# Patient Record
Sex: Female | Born: 1980 | Race: White | Hispanic: No | Marital: Married | Smoking: Never smoker
Health system: Southern US, Community
[De-identification: ages and names within clinical notes are randomized; demographics above are authoritative.]

## PROBLEM LIST (undated history)

## (undated) DIAGNOSIS — N200 Calculus of kidney: Secondary | ICD-10-CM

## (undated) DIAGNOSIS — R7303 Prediabetes: Secondary | ICD-10-CM

## (undated) HISTORY — PX: TONSILLECTOMY: SUR1361

---
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
## 2003-10-13 DIAGNOSIS — N200 Calculus of kidney: Secondary | ICD-10-CM

## 2003-10-13 HISTORY — DX: Calculus of kidney: N20.0

## 2014-02-22 ENCOUNTER — Emergency Department (HOSPITAL_COMMUNITY): Payer: BC Managed Care – PPO

## 2014-02-22 ENCOUNTER — Encounter (HOSPITAL_COMMUNITY): Payer: Self-pay | Admitting: Emergency Medicine

## 2014-02-22 ENCOUNTER — Emergency Department (HOSPITAL_COMMUNITY)
Admission: EM | Admit: 2014-02-22 | Discharge: 2014-02-22 | Disposition: A | Discharge status: Home / Self Care | Payer: BC Managed Care – PPO | Attending: Emergency Medicine | Admitting: Emergency Medicine

## 2014-02-22 DIAGNOSIS — M545 Low back pain, unspecified: Secondary | ICD-10-CM

## 2014-02-22 DIAGNOSIS — S161XXA Strain of muscle, fascia and tendon at neck level, initial encounter: Secondary | ICD-10-CM

## 2014-02-22 DIAGNOSIS — Z8639 Personal history of other endocrine, nutritional and metabolic disease: Secondary | ICD-10-CM | POA: Insufficient documentation

## 2014-02-22 DIAGNOSIS — R05 Cough: Secondary | ICD-10-CM | POA: Insufficient documentation

## 2014-02-22 DIAGNOSIS — R000 Tachycardia, unspecified: Secondary | ICD-10-CM | POA: Insufficient documentation

## 2014-02-22 DIAGNOSIS — R160 Hepatomegaly, not elsewhere classified: Secondary | ICD-10-CM | POA: Diagnosis present

## 2014-02-22 DIAGNOSIS — K7689 Other specified diseases of liver: Secondary | ICD-10-CM | POA: Insufficient documentation

## 2014-02-22 DIAGNOSIS — H833X9 Noise effects on inner ear, unspecified ear: Secondary | ICD-10-CM | POA: Insufficient documentation

## 2014-02-22 DIAGNOSIS — K760 Fatty (change of) liver, not elsewhere classified: Secondary | ICD-10-CM

## 2014-02-22 DIAGNOSIS — K7581 Nonalcoholic steatohepatitis (NASH): Secondary | ICD-10-CM | POA: Diagnosis present

## 2014-02-22 DIAGNOSIS — R109 Unspecified abdominal pain: Secondary | ICD-10-CM

## 2014-02-22 DIAGNOSIS — E66813 Obesity, class 3: Secondary | ICD-10-CM | POA: Diagnosis present

## 2014-02-22 DIAGNOSIS — R059 Cough, unspecified: Secondary | ICD-10-CM | POA: Insufficient documentation

## 2014-02-22 DIAGNOSIS — M542 Cervicalgia: Secondary | ICD-10-CM | POA: Insufficient documentation

## 2014-02-22 DIAGNOSIS — R0602 Shortness of breath: Secondary | ICD-10-CM | POA: Insufficient documentation

## 2014-02-22 DIAGNOSIS — R1084 Generalized abdominal pain: Secondary | ICD-10-CM | POA: Diagnosis present

## 2014-02-22 DIAGNOSIS — Z862 Personal history of diseases of the blood and blood-forming organs and certain disorders involving the immune mechanism: Secondary | ICD-10-CM | POA: Insufficient documentation

## 2014-02-22 DIAGNOSIS — R509 Fever, unspecified: Secondary | ICD-10-CM | POA: Insufficient documentation

## 2014-02-22 HISTORY — DX: Prediabetes: R73.03

## 2014-02-22 HISTORY — DX: Calculus of kidney: N20.0

## 2014-02-22 LAB — URINALYSIS, ROUTINE W REFLEX MICROSCOPIC
GLUCOSE, UA: NEGATIVE mg/dL
Ketones, ur: NEGATIVE mg/dL
LEUKOCYTES UA: NEGATIVE
Nitrite: NEGATIVE
PH: 5.5 (ref 5.0–8.0)
Protein, ur: NEGATIVE mg/dL
Specific Gravity, Urine: 1.023 (ref 1.005–1.030)
Urobilinogen, UA: 2 mg/dL — ABNORMAL HIGH (ref 0.0–1.0)

## 2014-02-22 LAB — COMPREHENSIVE METABOLIC PANEL
ALT: 44 U/L — ABNORMAL HIGH (ref 0–35)
AST: 44 U/L — ABNORMAL HIGH (ref 0–37)
Albumin: 3.8 g/dL (ref 3.5–5.2)
Alkaline Phosphatase: 172 U/L — ABNORMAL HIGH (ref 39–117)
BUN: 7 mg/dL (ref 6–23)
CHLORIDE: 99 meq/L (ref 96–112)
CO2: 25 meq/L (ref 19–32)
Calcium: 9.2 mg/dL (ref 8.4–10.5)
Creatinine, Ser: 0.62 mg/dL (ref 0.50–1.10)
GFR calc Af Amer: >90 mL/min (ref >90)
GFR calc non Af Amer: >90 mL/min (ref >90)
GLUCOSE: 105 mg/dL — AB (ref 70–99)
POTASSIUM: 3.9 meq/L (ref 3.7–5.3)
SODIUM: 137 meq/L (ref 137–147)
Total Bilirubin: 1.3 mg/dL — ABNORMAL HIGH (ref 0.3–1.2)
Total Protein: 7.3 g/dL (ref 6.0–8.3)

## 2014-02-22 LAB — CBC
HCT: 36.9 % (ref 36.0–46.0)
HEMOGLOBIN: 12.9 g/dL (ref 12.0–15.0)
MCH: 27.2 pg (ref 26.0–34.0)
MCHC: 35 g/dL (ref 30.0–36.0)
MCV: 77.8 fL — ABNORMAL LOW (ref 78.0–100.0)
Platelets: 180 10*3/uL (ref 150–400)
RBC: 4.74 MIL/uL (ref 3.87–5.11)
RDW: 14.1 % (ref 11.5–15.5)
WBC: 4 10*3/uL (ref 4.0–10.5)

## 2014-02-22 LAB — URINE MICROSCOPIC-ADD ON

## 2014-02-22 LAB — POC URINE PREG, ED: Preg Test, Ur: NEGATIVE

## 2014-02-22 MED ORDER — MORPHINE SULFATE 4 MG/ML IJ SOLN
4.0000 mg | Freq: Once | INTRAMUSCULAR | Status: AC
  Administered 2014-02-22: 4 mg via INTRAVENOUS
  Filled 2014-02-22: qty 1

## 2014-02-22 MED ORDER — METHOCARBAMOL 500 MG PO TABS: 500.0000 mg | ORAL_TABLET | Freq: Three times a day (TID) | ORAL | Status: DC | PRN

## 2014-02-22 MED ORDER — HYDROCODONE-ACETAMINOPHEN 5-325 MG PO TABS: 1.0000 | ORAL_TABLET | ORAL | Status: DC | PRN

## 2014-02-22 MED ORDER — ACETAMINOPHEN 325 MG PO TABS
650.0000 mg | ORAL_TABLET | Freq: Once | ORAL | Status: AC
  Administered 2014-02-22: 650 mg via ORAL
  Filled 2014-02-22: qty 2

## 2014-02-22 MED ORDER — HYDROMORPHONE HCL PF 1 MG/ML IJ SOLN
1.0000 mg | Freq: Once | INTRAMUSCULAR | Status: AC
  Administered 2014-02-22: 1 mg via INTRAVENOUS
  Filled 2014-02-22: qty 1

## 2014-02-22 MED ORDER — IOHEXOL 300 MG/ML  SOLN
50.0000 mL | Freq: Once | INTRAMUSCULAR | Status: AC | PRN
  Administered 2014-02-22: 50 mL via ORAL

## 2014-02-22 MED ORDER — SODIUM CHLORIDE 0.9 % IV BOLUS (SEPSIS)
500.0000 mL | Freq: Once | INTRAVENOUS | Status: AC
  Administered 2014-02-22: 500 mL via INTRAVENOUS

## 2014-02-22 MED ORDER — IOHEXOL 300 MG/ML  SOLN
100.0000 mL | Freq: Once | INTRAMUSCULAR | Status: AC | PRN
  Administered 2014-02-22: 100 mL via INTRAVENOUS
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
## 2014-02-22 NOTE — Discharge Instructions (Signed)
Take Vicodin for severe pain only. No driving or operating heavy machinery while taking vicodin. This medication may cause drowsiness. Take Robaxin as needed as directed for muscle spasm. No driving or operating heavy machinery while taking robaxin. This medication may cause drowsiness.  Abdominal Pain, Adult Many things can cause abdominal pain. Usually, abdominal pain is not caused by a disease and will improve without treatment. It can often be observed and treated at home. Your health care provider will do a physical exam and possibly order blood tests and X-rays to help determine the seriousness of your pain. However, in many cases, more time must pass before a clear cause of the pain can be found. Before that point, your health care provider may not know if you need more testing or further treatment. HOME CARE INSTRUCTIONS  Monitor your abdominal pain for any changes. The following actions may help to alleviate any discomfort you are experiencing:  Only take over-the-counter or prescription medicines as directed by your health care provider.  Do not take laxatives unless directed to do so by your health care provider.  Try a clear liquid diet (broth, tea, or water) as directed by your health care provider. Slowly move to a bland diet as tolerated. SEEK MEDICAL CARE IF:  You have unexplained abdominal pain.  You have abdominal pain associated with nausea or diarrhea.  You have pain when you urinate or have a bowel movement.  You experience abdominal pain that wakes you in the night.  You have abdominal pain that is worsened or improved by eating food.  You have abdominal pain that is worsened with eating fatty foods. SEEK IMMEDIATE MEDICAL CARE IF:   Your pain does not go away within 2 hours.  You have a fever.  You keep throwing up (vomiting).  Your pain is felt only in portions of the abdomen, such as the right side or the left lower portion of the abdomen.  You pass bloody  or black tarry stools. MAKE SURE YOU:  Understand these instructions.   Will watch your condition.   Will get help right away if you are not doing well or get worse.  Document Released: 07/08/2005 Document Revised: 07/19/2013 Document Reviewed: 06/07/2013 [facility] Patient Information 2014 [place], [place].  Cervical Sprain A cervical sprain is an injury in the neck in which the strong, fibrous tissues (ligaments) that connect your neck bones stretch or tear. Cervical sprains can range from mild to severe. Severe cervical sprains can cause the neck vertebrae to be unstable. This can lead to damage of the spinal cord and can result in serious nervous system problems. The amount of time it takes for a cervical sprain to get better depends on the cause and extent of the injury. Most cervical sprains heal in 1 to 3 weeks. CAUSES  Severe cervical sprains may be caused by:   Contact sport injuries (such as from football, rugby, wrestling, hockey, auto racing, gymnastics, diving, martial arts, or boxing).   Motor vehicle collisions.   Whiplash injuries. This is an injury from a sudden forward-and backward whipping movement of the head and neck.  Falls.  Mild cervical sprains may be caused by:   Being in an awkward position, such as while cradling a telephone between your ear and shoulder.   Sitting in a chair that does not offer proper support.   Working at a poorly Marketing executive station.   Looking up or down for long periods of time.  SYMPTOMS   Pain, soreness,  stiffness, or a burning sensation in the front, back, or sides of the neck. This discomfort may develop immediately after the injury or slowly, 24 hours or more after the injury.   Pain or tenderness directly in the middle of the back of the neck.   Shoulder or upper back pain.   Limited ability to move the neck.   Headache.   Dizziness.   Weakness, numbness, or tingling in the hands or arms.    Muscle spasms.   Difficulty swallowing or chewing.   Tenderness and swelling of the neck.  DIAGNOSIS  Most of the time your health care provider can diagnose a cervical sprain by taking your history and doing a physical exam. Your health care provider will ask about previous neck injuries and any known neck problems, such as arthritis in the neck. X-rays may be taken to find out if there are any other problems, such as with the bones of the neck. Other tests, such as a CT scan or MRI, may also be needed.  TREATMENT  Treatment depends on the severity of the cervical sprain. Mild sprains can be treated with rest, keeping the neck in place (immobilization), and pain medicines. Severe cervical sprains are immediately immobilized. Further treatment is done to help with pain, muscle spasms, and other symptoms and may include:  Medicines, such as pain relievers, numbing medicines, or muscle relaxants.   Physical therapy. This may involve stretching exercises, strengthening exercises, and posture training. Exercises and improved posture can help stabilize the neck, strengthen muscles, and help stop symptoms from returning.  HOME CARE INSTRUCTIONS   Put ice on the injured area.   Put ice in a plastic bag.   Place a towel between your skin and the bag.   Leave the ice on for 15 20 minutes, 3 4 times a day.   If your injury was severe, you may have been given a cervical collar to wear. A cervical collar is a two-piece collar designed to keep your neck from moving while it heals.  Do not remove the collar unless instructed by your health care provider.  If you have long hair, keep it outside of the collar.  Ask your health care provider before making any adjustments to your collar. Minor adjustments may be required over time to improve comfort and reduce pressure on your chin or on the back of your head.  Ifyou are allowed to remove the collar for cleaning or bathing, follow your  health care provider's instructions on how to do so safely.  Keep your collar clean by wiping it with mild soap and water and drying it completely. If the collar you have been given includes removable pads, remove them every 1 2 days and hand wash them with soap and water. Allow them to air dry. They should be completely dry before you wear them in the collar.  If you are allowed to remove the collar for cleaning and bathing, wash and dry the skin of your neck. Check your skin for irritation or sores. If you see any, tell your health care provider.  Do not drive while wearing the collar.   Only take over-the-counter or prescription medicines for pain, discomfort, or fever as directed by your health care provider.   Keep all follow-up appointments as directed by your health care provider.   Keep all physical therapy appointments as directed by your health care provider.   Make any needed adjustments to your workstation to promote good posture.  Avoid positions and activities that make your symptoms worse.   Warm up and stretch before being active to help prevent problems.  SEEK MEDICAL CARE IF:   Your pain is not controlled with medicine.   You are unable to decrease your pain medicine over time as planned.   Your activity level is not improving as expected.  SEEK IMMEDIATE MEDICAL CARE IF:   You develop any bleeding.  You develop stomach upset.  You have signs of an allergic reaction to your medicine.   Your symptoms get worse.   You develop new, unexplained symptoms.   You have numbness, tingling, weakness, or paralysis in any part of your body.  MAKE SURE YOU:   Understand these instructions.  Will watch your condition.  Will get help right away if you are not doing well or get worse. Document Released: 07/26/2007 Document Revised: 07/19/2013 Document Reviewed: 04/05/2013 [facility] Patient Information 2014 [place], [place].  Fatty Liver Fatty liver is  the accumulation of fat in liver cells. It is also called hepatosteatosis or steatohepatitis. It is normal for your liver to contain some fat. If fat is more than 5 to 10% of your liver's weight, you have fatty liver.  There are often no symptoms (problems) for years while damage is still occurring. People often learn about their fatty liver when they have medical tests for other reasons. Fat can damage your liver for years or even decades without causing problems. When it becomes severe, it can cause fatigue, weight loss, weakness, and confusion. This makes you more likely to develop more serious liver problems. The liver is the largest organ in the body. It does a lot of work and often gives no warning signs when it is sick until late in a disease. The liver has many important jobs including:  Breaking down foods.  Storing vitamins, iron, and other minerals.  Making proteins.  Making bile for food digestion.  Breaking down many products including medications, alcohol and some poisons. CAUSES  There are a number of different conditions, medications, and poisons that can cause a fatty liver. Eating too many calories causes fat to build up in the liver. Not processing and breaking fats down normally may also cause this. Certain conditions, such as obesity, diabetes, and high triglycerides also cause this. Most fatty liver patients tend to be middle-aged and over weight.  Some causes of fatty liver are:  Alcohol over consumption.  Malnutrition.  Steroid use.  Valproic acid toxicity.  Obesity.  Cushing's syndrome.  Poisons.  Tetracycline in high dosages.  Pregnancy.  Diabetes.  Hyperlipidemia.  Rapid weight loss. Some people develop fatty liver even having none of these conditions. SYMPTOMS  Fatty liver most often causes no problems. This is called asymptomatic.  It can be diagnosed with blood tests and also by a liver biopsy.  It is one of the most common causes of minor  elevations of liver enzymes on routine blood tests.  Specialized Imaging of the liver using ultrasound, CT (computed tomography) scan, or MRI (magnetic resonance imaging) can suggest a fatty liver but a biopsy is needed to confirm it.  A biopsy involves taking a small sample of liver tissue. This is done by using a needle. It is then looked at under a microscope by a specialist. TREATMENT  It is important to treat the cause. Simple fatty liver without a medical reason may not need treatment.  Weight loss, fat restriction, and exercise in overweight patients produces inconsistent results but is worth  trying.  Fatty liver due to alcohol toxicity may not improve even with stopping drinking.  Good control of diabetes may reduce fatty liver.  Lower your triglycerides through diet, medication or both.  Eat a balanced, healthy diet.  Increase your physical activity.  Get regular checkups from a liver specialist.  There are no medical or surgical treatments for a fatty liver or NASH, but improving your diet and increasing your exercise may help prevent or reverse some of the damage. PROGNOSIS  Fatty liver may cause no damage or it can lead to an inflammation of the liver. This is, called steatohepatitis. When it is linked to alcohol abuse, it is called alcoholic steatohepatitis. It often is not linked to alcohol. It is then called nonalcoholic steatohepatitis, or NASH. Over time the liver may become scarred and hardened. This condition is called cirrhosis. Cirrhosis is serious and may lead to liver failure or cancer. NASH is one of the leading causes of cirrhosis. About 10-20% of Americans have fatty liver and a smaller 2-5% has NASH. Document Released: 11/13/2005 Document Revised: 12/21/2011 Document Reviewed: 01/06/2006 [facility] Patient Information 2014 [place], [place].  Fever, Adult A fever is a higher than normal body temperature. In an adult, an oral temperature around 98.6 F (37 C) is  considered normal. A temperature of 100.4 F (38 C) or higher is generally considered a fever. Mild or moderate fevers generally have no long-term effects and often do not require treatment. Extreme fever (greater than or equal to 106 F or 41.1 C) can cause seizures. The sweating that may occur with repeated or prolonged fever may cause dehydration. Elderly people can develop confusion during a fever. A measured temperature can vary with:  Age.  Time of day.  Method of measurement (mouth, underarm, rectal, or ear). The fever is confirmed by taking a temperature with a thermometer. Temperatures can be taken different ways. Some methods are accurate and some are not.  An oral temperature is used most commonly. Electronic thermometers are fast and accurate.  An ear temperature will only be accurate if the thermometer is positioned as recommended by the manufacturer.  A rectal temperature is accurate and done for those adults who have a condition where an oral temperature cannot be taken.  An underarm (axillary) temperature is not accurate and not recommended. Fever is a symptom, not a disease.  CAUSES   Infections commonly cause fever.  Some noninfectious causes for fever include:  Some arthritis conditions.  Some thyroid or adrenal gland conditions.  Some immune system conditions.  Some types of cancer.  A medicine reaction.  High doses of certain street drugs such as methamphetamine.  Dehydration.  Exposure to high outside or room temperatures.  Occasionally, the source of a fever cannot be determined. This is sometimes called a "fever of unknown origin" (FUO).  Some situations may lead to a temporary rise in body temperature that may go away on its own. Examples are:  Childbirth.  Surgery.  Intense exercise. HOME CARE INSTRUCTIONS   Take appropriate medicines for fever. Follow dosing instructions carefully. If you use acetaminophen to reduce the fever, be careful  to avoid taking other medicines that also contain acetaminophen. Do not take aspirin for a fever if you are younger than age 64. There is an association with Reye's syndrome. Reye's syndrome is a rare but potentially deadly disease.  If an infection is present and antibiotics have been prescribed, take them as directed. Finish them even if you start to feel better.  Rest as needed.  Maintain an adequate fluid intake. To prevent dehydration during an illness with prolonged or recurrent fever, you may need to drink extra fluid.Drink enough fluids to keep your urine clear or pale yellow.  Sponging or bathing with room temperature water may help reduce body temperature. Do not use ice water or alcohol sponge baths.  Dress comfortably, but do not over-bundle. SEEK MEDICAL CARE IF:   You are unable to keep fluids down.  You develop vomiting or diarrhea.  You are not feeling at least partly better after 3 days.  You develop new symptoms or problems. SEEK IMMEDIATE MEDICAL CARE IF:   You have shortness of breath or trouble breathing.  You develop excessive weakness.  You are dizzy or you faint.  You are extremely thirsty or you are making little or no urine.  You develop new pain that was not there before (such as in the head, neck, chest, back, or abdomen).  You have persistant vomiting and diarrhea for more than 1 to 2 days.  You develop a stiff neck or your eyes become sensitive to light.  You develop a skin rash.  You have a fever or persistent symptoms for more than 2 to 3 days.  You have a fever and your symptoms suddenly get worse. MAKE SURE YOU:   Understand these instructions.  Will watch your condition.  Will get help right away if you are not doing well or get worse. Document Released: 03/24/2001 Document Revised: 12/21/2011 Document Reviewed: 07/30/2011 [facility] Patient Information 2014 [place], [place].

## 2014-02-26 NOTE — ED Provider Notes (Signed)
Medical screening examination/treatment/procedure(s) were performed by non-physician practitioner and as supervising physician I was immediately available for consultation/collaboration.   EKG Interpretation   Date/Time:  Thursday Feb 22 2014 14:52:17 EDT Ventricular Rate:  106 PR Interval:  137 QRS Duration: 94 QT Interval:  325 QTC Calculation: 431 R Axis:   1 Text Interpretation:  Sinus tachycardia Confirmed by Freida BusmanALLEN  MD, Dempsey Ahonen  ([id number]) on 02/22/2014 8:59:10 PM       Toy BakerAnthony T Emanie Behan, MD 02/26/14 [phone]

## 2014-02-27 ENCOUNTER — Observation Stay (HOSPITAL_COMMUNITY)
Admission: EM | Admit: 2014-02-27 | Discharge: 2014-03-01 | Disposition: A | Discharge status: Home / Self Care | Payer: BC Managed Care – PPO | Attending: Family Medicine | Admitting: Family Medicine

## 2014-02-27 ENCOUNTER — Encounter (HOSPITAL_COMMUNITY): Payer: Self-pay | Admitting: Emergency Medicine

## 2014-02-27 DIAGNOSIS — R7309 Other abnormal glucose: Secondary | ICD-10-CM | POA: Diagnosis present

## 2014-02-27 DIAGNOSIS — K769 Liver disease, unspecified: Principal | ICD-10-CM | POA: Insufficient documentation

## 2014-02-27 DIAGNOSIS — R945 Abnormal results of liver function studies: Secondary | ICD-10-CM | POA: Diagnosis present

## 2014-02-27 DIAGNOSIS — IMO0001 Reserved for inherently not codable concepts without codable children: Secondary | ICD-10-CM | POA: Insufficient documentation

## 2014-02-27 DIAGNOSIS — K7581 Nonalcoholic steatohepatitis (NASH): Secondary | ICD-10-CM

## 2014-02-27 DIAGNOSIS — R1012 Left upper quadrant pain: Secondary | ICD-10-CM | POA: Insufficient documentation

## 2014-02-27 DIAGNOSIS — R1084 Generalized abdominal pain: Secondary | ICD-10-CM

## 2014-02-27 DIAGNOSIS — R160 Hepatomegaly, not elsewhere classified: Secondary | ICD-10-CM

## 2014-02-27 DIAGNOSIS — R748 Abnormal levels of other serum enzymes: Secondary | ICD-10-CM | POA: Diagnosis present

## 2014-02-27 DIAGNOSIS — E1165 Type 2 diabetes mellitus with hyperglycemia: Secondary | ICD-10-CM

## 2014-02-27 DIAGNOSIS — K7689 Other specified diseases of liver: Secondary | ICD-10-CM | POA: Insufficient documentation

## 2014-02-27 DIAGNOSIS — R17 Unspecified jaundice: Secondary | ICD-10-CM | POA: Insufficient documentation

## 2014-02-27 LAB — CBC WITH DIFFERENTIAL/PLATELET
BASOS PCT: 0 % (ref 0–1)
Basophils Absolute: 0 10*3/uL (ref 0.0–0.1)
EOS PCT: 3 % (ref 0–5)
Eosinophils Absolute: 0.2 10*3/uL (ref 0.0–0.7)
HEMATOCRIT: 39.8 % (ref 36.0–46.0)
HEMOGLOBIN: 14 g/dL (ref 12.0–15.0)
Lymphocytes Relative: 32 % (ref 12–46)
Lymphs Abs: 1.8 10*3/uL (ref 0.7–4.0)
MCH: 27.6 pg (ref 26.0–34.0)
MCHC: 35.2 g/dL (ref 30.0–36.0)
MCV: 78.5 fL (ref 78.0–100.0)
MONOS PCT: 7 % (ref 3–12)
Monocytes Absolute: 0.4 10*3/uL (ref 0.1–1.0)
NEUTROS ABS: 3.3 10*3/uL (ref 1.7–7.7)
Neutrophils Relative %: 58 % (ref 43–77)
Platelets: 194 10*3/uL (ref 150–400)
RBC: 5.07 MIL/uL (ref 3.87–5.11)
RDW: 14.7 % (ref 11.5–15.5)
WBC: 5.7 10*3/uL (ref 4.0–10.5)

## 2014-02-27 LAB — COMPREHENSIVE METABOLIC PANEL
ALBUMIN: 3.5 g/dL (ref 3.5–5.2)
ALT: 133 U/L — ABNORMAL HIGH (ref 0–35)
AST: 108 U/L — ABNORMAL HIGH (ref 0–37)
Alkaline Phosphatase: 661 U/L — ABNORMAL HIGH (ref 39–117)
BUN: 11 mg/dL (ref 6–23)
CHLORIDE: 97 meq/L (ref 96–112)
CO2: 25 meq/L (ref 19–32)
CREATININE: 0.55 mg/dL (ref 0.50–1.10)
Calcium: 9.6 mg/dL (ref 8.4–10.5)
GFR calc Af Amer: >90 mL/min (ref >90)
Glucose, Bld: 204 mg/dL — ABNORMAL HIGH (ref 70–99)
Potassium: 4.3 mEq/L (ref 3.7–5.3)
Sodium: 138 mEq/L (ref 137–147)
Total Bilirubin: 5.3 mg/dL — ABNORMAL HIGH (ref 0.3–1.2)
Total Protein: 7.7 g/dL (ref 6.0–8.3)

## 2014-02-27 LAB — URINE MICROSCOPIC-ADD ON

## 2014-02-27 LAB — URINALYSIS, ROUTINE W REFLEX MICROSCOPIC
GLUCOSE, UA: NEGATIVE mg/dL
Hgb urine dipstick: NEGATIVE
KETONES UR: NEGATIVE mg/dL
Nitrite: NEGATIVE
Protein, ur: NEGATIVE mg/dL
Specific Gravity, Urine: 1.025 (ref 1.005–1.030)
Urobilinogen, UA: 2 mg/dL — ABNORMAL HIGH (ref 0.0–1.0)
pH: 6 (ref 5.0–8.0)

## 2014-02-27 LAB — PREGNANCY, URINE: PREG TEST UR: NEGATIVE

## 2014-02-27 LAB — LIPASE, BLOOD: Lipase: 38 U/L (ref 11–59)
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
## 2014-02-27 NOTE — ED Notes (Signed)
Pt states she was seen here Thursday with abd pain, neck, back, and head pain  [facility] with [facility] and saw Dr Jillyn HiddenFulp  Pt states she noticed her skin was kind of yellowish  The office called her today and told her that her liver studies and her glucose came back abnormal and she needed to come here for further evaluation and possible admission  Family states she had ketones and bilirubin in her urine  Pt was sent home with vicodin and robaxin from [place]

## 2014-02-28 ENCOUNTER — Inpatient Hospital Stay (HOSPITAL_COMMUNITY): Payer: BC Managed Care – PPO

## 2014-02-28 ENCOUNTER — Encounter (HOSPITAL_COMMUNITY): Payer: Self-pay | Admitting: Radiology

## 2014-02-28 ENCOUNTER — Ambulatory Visit (HOSPITAL_COMMUNITY): Payer: BC Managed Care – PPO

## 2014-02-28 DIAGNOSIS — K769 Liver disease, unspecified: Principal | ICD-10-CM

## 2014-02-28 DIAGNOSIS — R7309 Other abnormal glucose: Secondary | ICD-10-CM

## 2014-02-28 DIAGNOSIS — K7689 Other specified diseases of liver: Secondary | ICD-10-CM

## 2014-02-28 DIAGNOSIS — R945 Abnormal results of liver function studies: Secondary | ICD-10-CM | POA: Diagnosis present

## 2014-02-28 DIAGNOSIS — R748 Abnormal levels of other serum enzymes: Secondary | ICD-10-CM | POA: Diagnosis present

## 2014-02-28 LAB — HEPATITIS PANEL, ACUTE
HCV Ab: NEGATIVE
HEP A IGM: NONREACTIVE
Hep B C IgM: NONREACTIVE
Hepatitis B Surface Ag: NEGATIVE

## 2014-02-28 LAB — HEMOGLOBIN A1C
Hgb A1c MFr Bld: 6.8 % — ABNORMAL HIGH (ref <5.7)
Mean Plasma Glucose: 148 mg/dL — ABNORMAL HIGH (ref <117)

## 2014-02-28 LAB — COMPREHENSIVE METABOLIC PANEL
ALK PHOS: 641 U/L — AB (ref 39–117)
ALT: 131 U/L — ABNORMAL HIGH (ref 0–35)
AST: 119 U/L — ABNORMAL HIGH (ref 0–37)
Albumin: 3.1 g/dL — ABNORMAL LOW (ref 3.5–5.2)
BUN: 13 mg/dL (ref 6–23)
CALCIUM: 9.3 mg/dL (ref 8.4–10.5)
CO2: 27 meq/L (ref 19–32)
Chloride: 98 mEq/L (ref 96–112)
Creatinine, Ser: 0.64 mg/dL (ref 0.50–1.10)
GLUCOSE: 208 mg/dL — AB (ref 70–99)
POTASSIUM: 3.9 meq/L (ref 3.7–5.3)
SODIUM: 137 meq/L (ref 137–147)
Total Bilirubin: 4.7 mg/dL — ABNORMAL HIGH (ref 0.3–1.2)
Total Protein: 6.6 g/dL (ref 6.0–8.3)

## 2014-02-28 LAB — GLUCOSE, CAPILLARY
GLUCOSE-CAPILLARY: 145 mg/dL — AB (ref 70–99)
Glucose-Capillary: 237 mg/dL — ABNORMAL HIGH (ref 70–99)
Glucose-Capillary: 182 mg/dL — ABNORMAL HIGH (ref 70–99)
Glucose-Capillary: 159 mg/dL — ABNORMAL HIGH (ref 70–99)
Glucose-Capillary: 136 mg/dL — ABNORMAL HIGH (ref 70–99)
Glucose-Capillary: 223 mg/dL — ABNORMAL HIGH (ref 70–99)

## 2014-02-28 LAB — CK: Total CK: 15 U/L (ref 7–177)

## 2014-02-28 LAB — CBC
HCT: 38 % (ref 36.0–46.0)
Hemoglobin: 12.9 g/dL (ref 12.0–15.0)
MCH: 26.9 pg (ref 26.0–34.0)
MCHC: 33.9 g/dL (ref 30.0–36.0)
MCV: 79.3 fL (ref 78.0–100.0)
Platelets: 187 10*3/uL (ref 150–400)
RBC: 4.79 MIL/uL (ref 3.87–5.11)
RDW: 14.8 % (ref 11.5–15.5)
WBC: 5.7 10*3/uL (ref 4.0–10.5)

## 2014-02-28 LAB — HEPATIC FUNCTION PANEL
ALT: 131 U/L — AB (ref 0–35)
AST: 123 U/L — AB (ref 0–37)
Albumin: 3.2 g/dL — ABNORMAL LOW (ref 3.5–5.2)
Alkaline Phosphatase: 625 U/L — ABNORMAL HIGH (ref 39–117)
Bilirubin, Direct: 3.6 mg/dL — ABNORMAL HIGH (ref 0.0–0.3)
Indirect Bilirubin: 1 mg/dL — ABNORMAL HIGH (ref 0.3–0.9)
TOTAL PROTEIN: 6.7 g/dL (ref 6.0–8.3)
Total Bilirubin: 4.6 mg/dL — ABNORMAL HIGH (ref 0.3–1.2)

## 2014-02-28 LAB — PROTIME-INR
INR: 0.99 (ref 0.00–1.49)
PROTHROMBIN TIME: 12.9 s (ref 11.6–15.2)

## 2014-02-28 MED ORDER — BISACODYL 10 MG RE SUPP: 10.0000 mg | Freq: Every day | RECTAL | Status: DC | PRN

## 2014-02-28 MED ORDER — INSULIN ASPART 100 UNIT/ML ~~LOC~~ SOLN
0.0000 [IU] | SUBCUTANEOUS | Status: DC
  Administered 2014-02-28: 3 [IU] via SUBCUTANEOUS
  Administered 2014-02-28: 2 [IU] via SUBCUTANEOUS
  Administered 2014-02-28: 5 [IU] via SUBCUTANEOUS

## 2014-02-28 MED ORDER — IBUPROFEN 800 MG PO TABS
800.0000 mg | ORAL_TABLET | Freq: Three times a day (TID) | ORAL | Status: DC | PRN
  Administered 2014-02-28: 800 mg via ORAL
  Filled 2014-02-28: qty 1

## 2014-02-28 MED ORDER — FOLIC ACID 1 MG PO TABS
1.0000 mg | ORAL_TABLET | Freq: Every day | ORAL | Status: DC
  Administered 2014-02-28 – 2014-03-01 (×2): 1 mg via ORAL
  Filled 2014-02-28 (×2): qty 1

## 2014-02-28 MED ORDER — ALUM & MAG HYDROXIDE-SIMETH 200-200-20 MG/5ML PO SUSP: 30.0000 mL | Freq: Four times a day (QID) | ORAL | Status: DC | PRN

## 2014-02-28 MED ORDER — VITAMIN B-1 100 MG PO TABS
100.0000 mg | ORAL_TABLET | Freq: Every day | ORAL | Status: DC
  Administered 2014-02-28 – 2014-03-01 (×2): 100 mg via ORAL
  Filled 2014-02-28 (×2): qty 1

## 2014-02-28 MED ORDER — ADULT MULTIVITAMIN W/MINERALS CH
1.0000 | ORAL_TABLET | Freq: Every day | ORAL | Status: DC
  Administered 2014-02-28 – 2014-03-01 (×2): 1 via ORAL
  Filled 2014-02-28 (×2): qty 1

## 2014-02-28 MED ORDER — DOCUSATE SODIUM 100 MG PO CAPS
100.0000 mg | ORAL_CAPSULE | Freq: Two times a day (BID) | ORAL | Status: DC
  Administered 2014-02-28 – 2014-03-01 (×3): 100 mg via ORAL
  Filled 2014-02-28 (×4): qty 1

## 2014-02-28 MED ORDER — IOHEXOL 300 MG/ML  SOLN
25.0000 mL | INTRAMUSCULAR | Status: AC
  Administered 2014-02-28 (×2): 25 mL via ORAL

## 2014-02-28 MED ORDER — ONDANSETRON HCL 4 MG PO TABS: 4.0000 mg | ORAL_TABLET | Freq: Four times a day (QID) | ORAL | Status: DC | PRN

## 2014-02-28 MED ORDER — IOHEXOL 300 MG/ML  SOLN
100.0000 mL | Freq: Once | INTRAMUSCULAR | Status: AC | PRN
  Administered 2014-02-28: 100 mL via INTRAVENOUS

## 2014-02-28 MED ORDER — SODIUM CHLORIDE 0.9 % IV SOLN
INTRAVENOUS | Status: DC
  Administered 2014-02-28: 02:00:00 via INTRAVENOUS

## 2014-02-28 MED ORDER — ONDANSETRON HCL 4 MG/2ML IJ SOLN: 4.0000 mg | Freq: Four times a day (QID) | INTRAMUSCULAR | Status: DC | PRN

## 2014-02-28 MED ORDER — INSULIN ASPART 100 UNIT/ML ~~LOC~~ SOLN
0.0000 [IU] | Freq: Three times a day (TID) | SUBCUTANEOUS | Status: DC
  Administered 2014-03-01: 2 [IU] via SUBCUTANEOUS
  Administered 2014-03-01: 3 [IU] via SUBCUTANEOUS

## 2014-02-28 NOTE — ED Provider Notes (Signed)
Medical screening examination/treatment/procedure(s) were performed by non-physician practitioner and as supervising physician I was immediately available for consultation/collaboration.   EKG Interpretation None       Olivia Mackielga M Damaris Geers, MD 02/28/14 [phone]
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
## 2014-02-28 NOTE — Progress Notes (Signed)
Inpatient Diabetes Program Recommendations  AACE/ADA: New Consensus Statement on Inpatient Glycemic Control (2013)  Target Ranges:  Prepandial:   less than 140 mg/dL      Peak postprandial:   less than 180 mg/dL (1-2 hours)      Critically ill patients:  140 - 180 mg/dL   Reason for Visit: Hyperglycemia Results for Shannon Zuniga, Shannon Zuniga (MRN [id number]) as of 02/28/2014 11:53  Ref. Range 02/28/2014 02:28 02/28/2014 04:18 02/28/2014 07:55  Glucose-Capillary Latest Range: 70-99 mg/dL 295237 (H) 284182 (H) 132159 (H)  Results for Shannon Zuniga, Shannon Zuniga (MRN [id number]) as of 02/28/2014 11:53  Ref. Range 02/27/2014 23:13 02/28/2014 04:10  Glucose Latest Range: 70-99 mg/dL 366204 (H) 440208 (H)     Inpatient Diabetes Program Recommendations Insulin - Basal: Consider addition of basal insulin - Lantus 10 units QHS HgbA1C: Check HgbA1c to assess glycemic control prior to admission  Note: Will continue to follow. Thank you. Ailene Ardshonda Warren Lindahl, RD, LDN, CDE Inpatient Diabetes Coordinator [phone]
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
## 2014-03-01 DIAGNOSIS — R748 Abnormal levels of other serum enzymes: Secondary | ICD-10-CM

## 2014-03-01 DIAGNOSIS — R160 Hepatomegaly, not elsewhere classified: Secondary | ICD-10-CM

## 2014-03-01 LAB — GLUCOSE, CAPILLARY
GLUCOSE-CAPILLARY: 208 mg/dL — AB (ref 70–99)
Glucose-Capillary: 161 mg/dL — ABNORMAL HIGH (ref 70–99)

## 2014-03-01 LAB — HEPATIC FUNCTION PANEL
ALBUMIN: 3.1 g/dL — AB (ref 3.5–5.2)
ALT: 174 U/L — ABNORMAL HIGH (ref 0–35)
AST: 176 U/L — AB (ref 0–37)
Alkaline Phosphatase: 676 U/L — ABNORMAL HIGH (ref 39–117)
BILIRUBIN DIRECT: 2.8 mg/dL — AB (ref 0.0–0.3)
Indirect Bilirubin: 1.1 mg/dL — ABNORMAL HIGH (ref 0.3–0.9)
Total Bilirubin: 3.9 mg/dL — ABNORMAL HIGH (ref 0.3–1.2)
Total Protein: 6.6 g/dL (ref 6.0–8.3)

## 2014-03-01 NOTE — Progress Notes (Signed)
Pt feeling better--slight LLQ discomfort.  Looks well, NAD.  No jaundice evident.  Bilirubin continues to improve, but there has been a slight rise in Alk Phos and transaminases.  IMPR:  Resolving acute liver injury ?medication-induced ("reactive hepatopathy)  PLAN:  OK for dischg; agree w/ plan for f/u LFT's through PCP's office.  I will be on standby in the event that the LFT's worsen or fail to improve in the coming weeks.  Cleotis Nipper, M.D. [phone]

## 2014-03-01 NOTE — Progress Notes (Signed)
Patient to be seen by me later today.  Evaluation of her labs from today show improvement in her bilirubin, with slight worsening of her transaminases.  Her ultrasound continues to show no evidence of biliary obstruction, nor any other abnormalities apart from the previously noted fatty infiltration. No significant change from her exam roughly a week earlier.  Preliminary impression: Reactive hepatopathy  Recommendation: I think we will be able to consider send this patient home for outpatient monitoring of her LFTs.  Florencia Reasonsobert V. Emojean Gertz, M.D. [phone]
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
## 2014-03-01 NOTE — Progress Notes (Signed)
Discharge instructions given to pt, verbalized understanding. Left the unit in stable condition. 
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
## 2015-12-30 IMAGING — CT CT ABDOMEN W/ CM
2 of 6 series · 17 of 46 positions shown, 19 images · IV contrast (OMNIPAQUE)
Comparison: CT scan 02/22/2014

CLINICAL DATA: Hepatomegaly and hepatic steatosis. Elevated liver
functions test.

EXAM:
CT ABDOMEN WITH CONTRAST
TECHNIQUE: Multidetector CT imaging of the abdomen was performed using the
standard protocol following bolus administration of intravenous
contrast.
CONTRAST:  1 OMNIPAQUE IOHEXOL 300 MG/ML SOLN, 100mL OMNIPAQUE
IOHEXOL 300 MG/ML SOLN

[Series 2: rtn a/p with · axial · 0.81mm/px · z∈[-332,-82]mm · 14 of 58 slices shown, 16 images]
[im 4/58  soft-tissue]
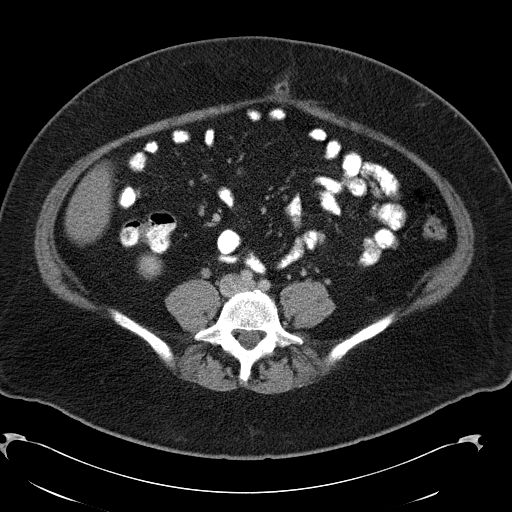
[im 4/58  bone]
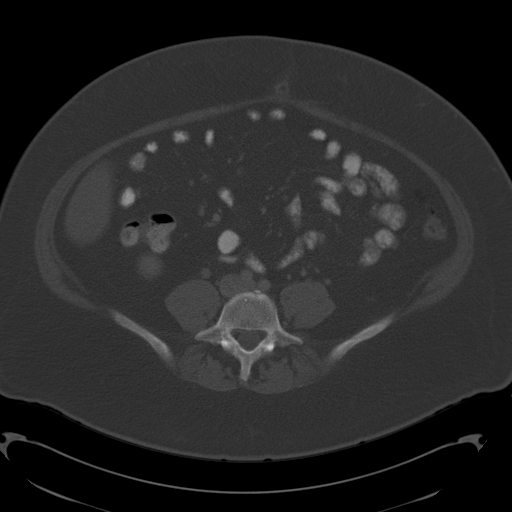
[im 8/58  soft-tissue]
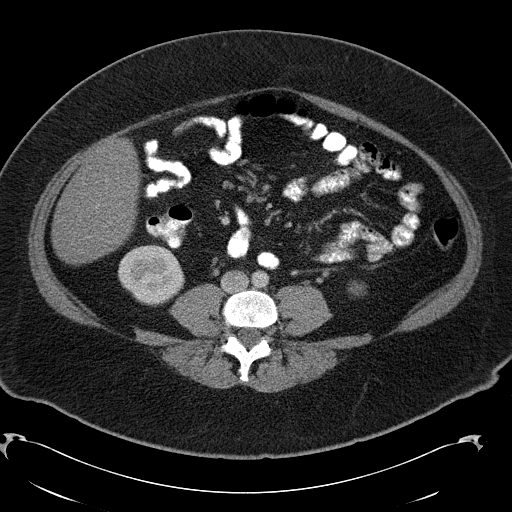
[im 12/58  soft-tissue]
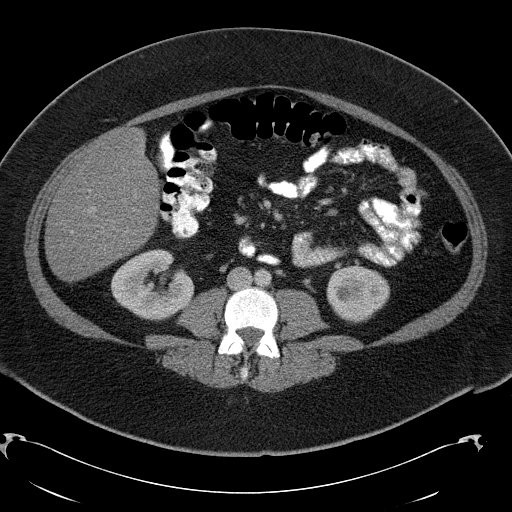
[im 16/58  soft-tissue]
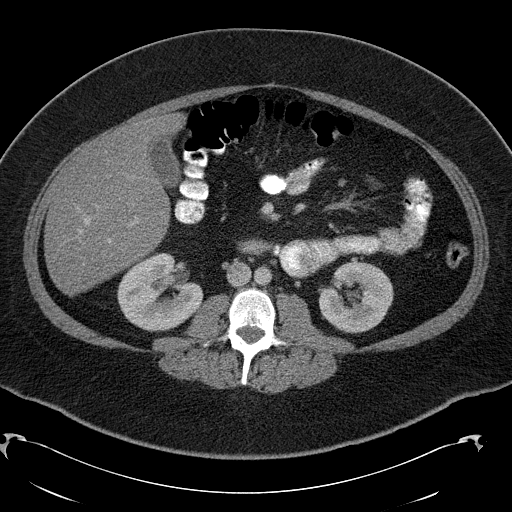
[im 20/58  soft-tissue]
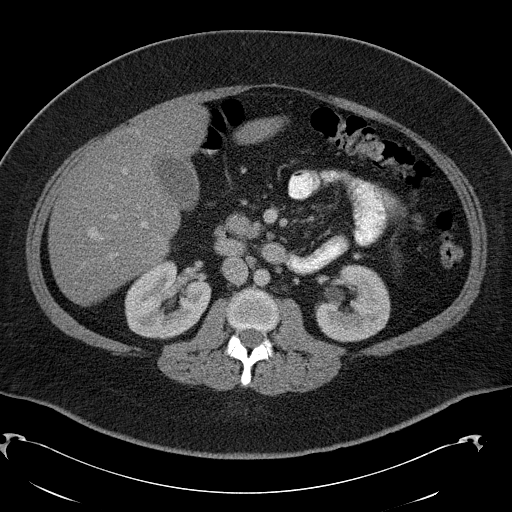
[im 23/58  soft-tissue]
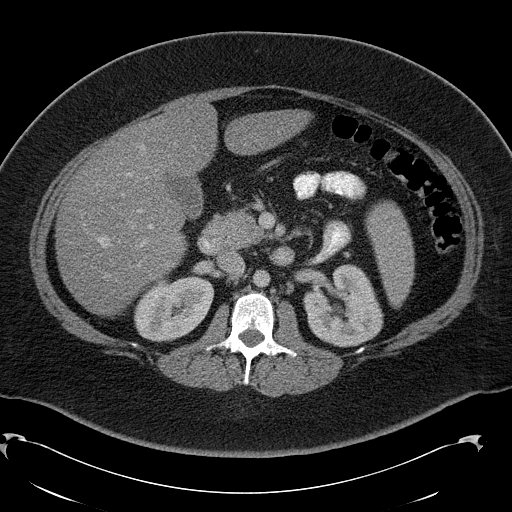
[im 27/58  soft-tissue]
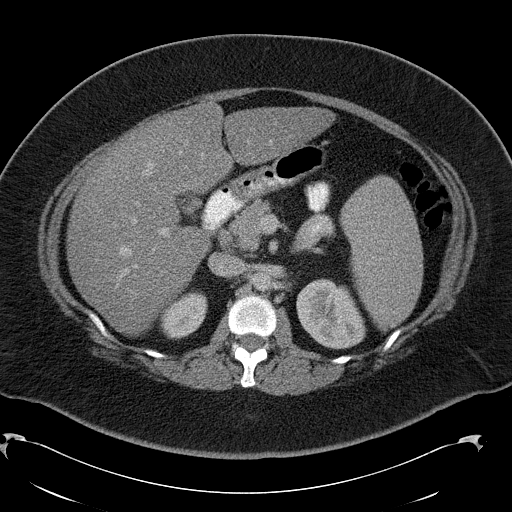
[im 31/58  soft-tissue]
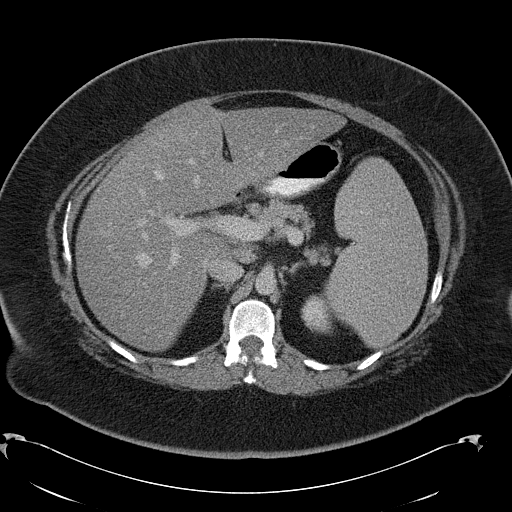
[im 35/58  soft-tissue]
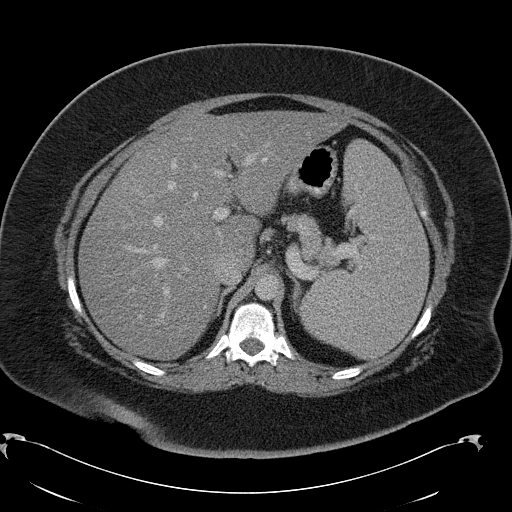
[im 35/58  bone]
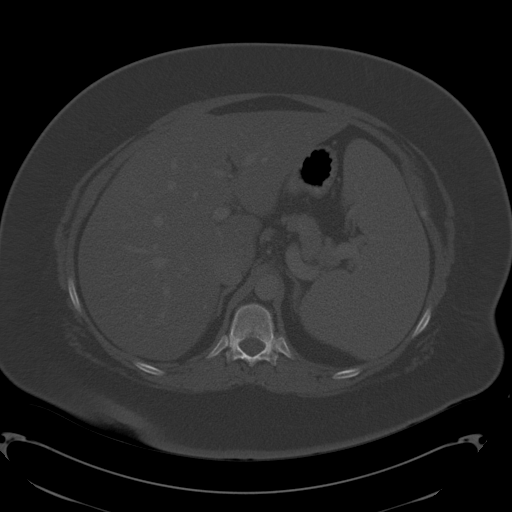
[im 39/58  soft-tissue]
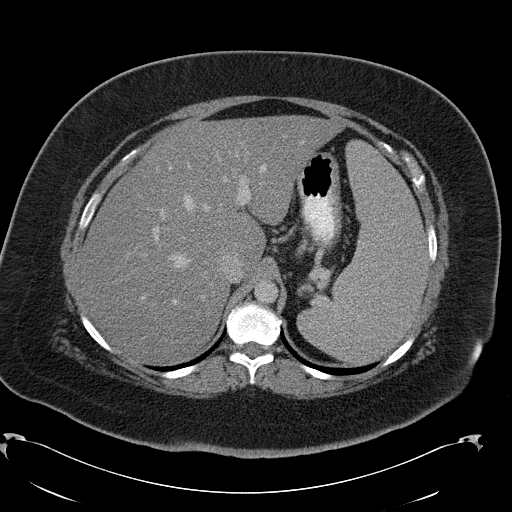
[im 42/58  soft-tissue]
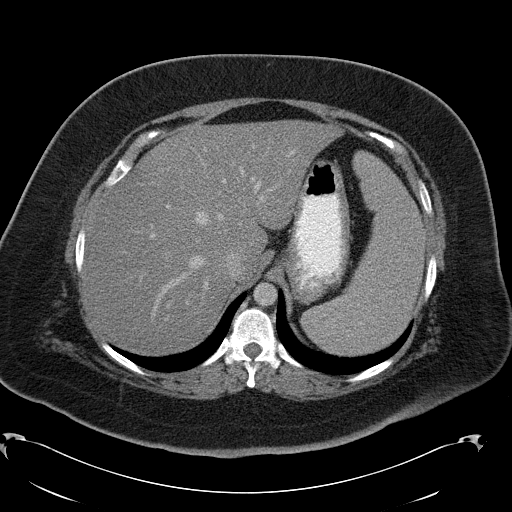
[im 46/58  soft-tissue]
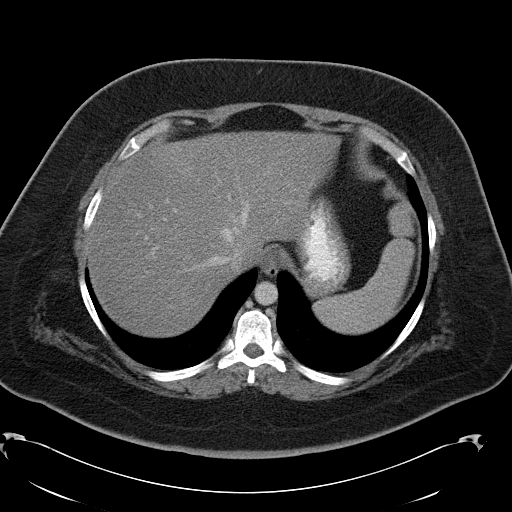
[im 50/58  soft-tissue]
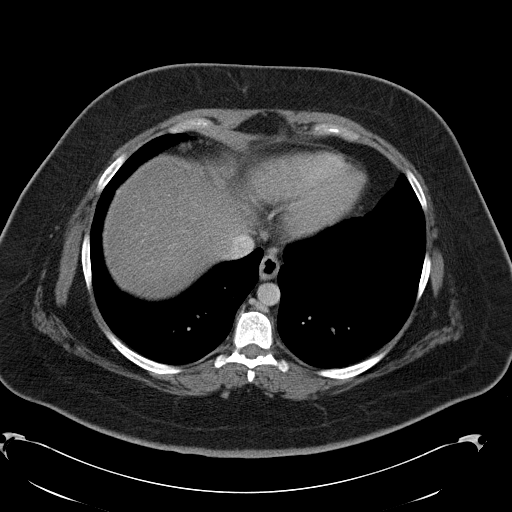
[im 54/58  soft-tissue]
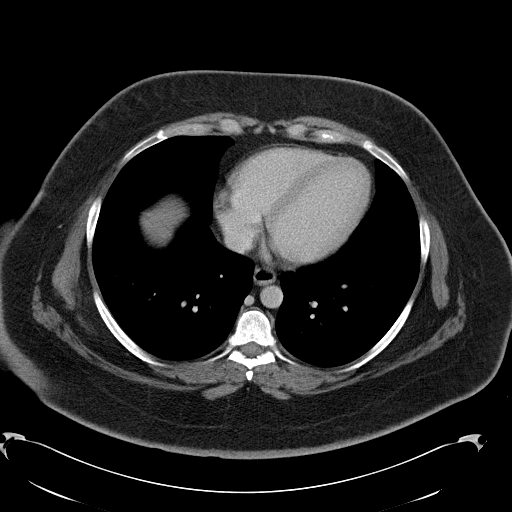

[Series 602: coronal images · coronal · 0.81mm/px · 3 of 100 slices shown]
[im 34/100  soft-tissue]
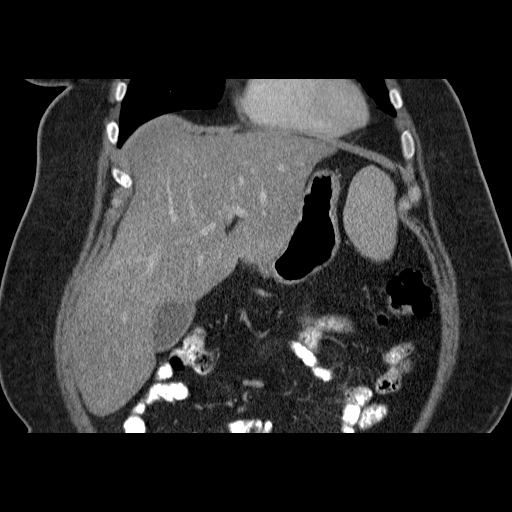
[im 45/100  soft-tissue]
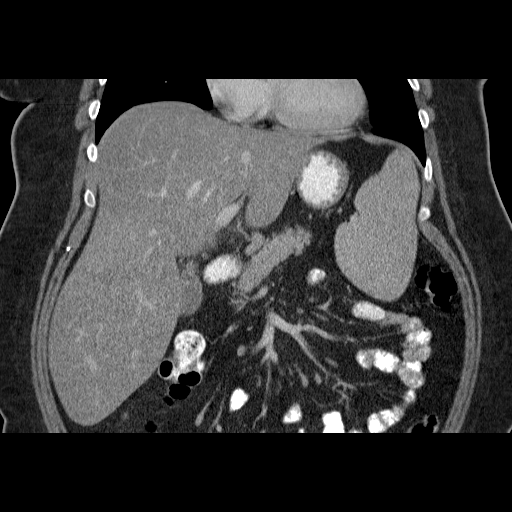
[im 56/100  soft-tissue]
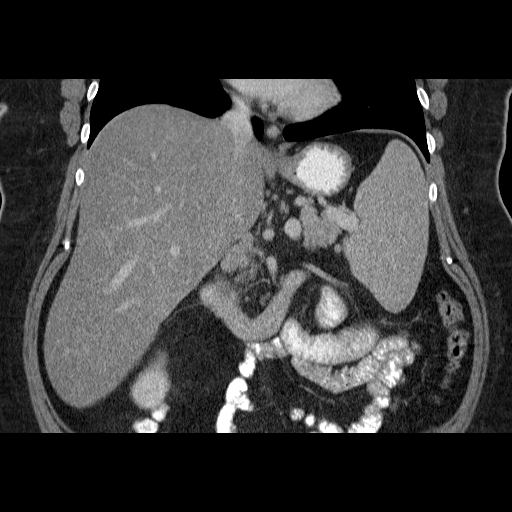

[17 of 46 positions shown; findings below may reference images not displayed]

FINDINGS: The lung bases are clear. No pleural effusion or pulmonary nodule.
The heart is normal in size. No pericardial effusion.

The liver is enlarged and there is diffuse fatty infiltration. . No
focal hepatic lesions or intrahepatic biliary dilatation. The
gallbladder wall appears mildly thickened. No definite gallstones
were seen on the recent ultrasound. The common bile duct is normal
in caliber and the pancreas is normal. The spleen is markedly
enlarged measuring 17.5 x 12.5 x 10 cm with a volume of 5555 cubic
cm. No focal splenic lesions. The portal and splenic veins are
patent.

The adrenal glands and kidneys are normal. No hydronephrosis, renal
calculi or renal mass.

The stomach, duodenum, visualized small bowel and colon are
unremarkable. No inflammatory changes, mass lesions or obstructive
findings. No mesenteric or retroperitoneal mass. Small scattered
mesenteric and retroperitoneal lymph nodes. The largest lymph node
is in the region of the hepatic duodenum ligament and measures 3.0 x
1.6 cm. The aorta and branch vessels are normal.

No significant bony findings.
IMPRESSION: 1. Hepatosplenomegaly.
2. Borderline upper abdominal lymph nodes.
3. No acute abdominal findings.

## 2015-12-30 IMAGING — US US ABDOMEN COMPLETE
1 series · 14 of 25 positions shown · non-contrast
Comparison: CT ABD/PELVIS W CM dated 02/22/2014

CLINICAL DATA: Elevated LFTs, hepatomegaly, obesity

EXAM:
ULTRASOUND ABDOMEN COMPLETE

[Series 1: us abdomen complete · 0.24mm/px · 14 of 105 slices shown]
[im 1/105]
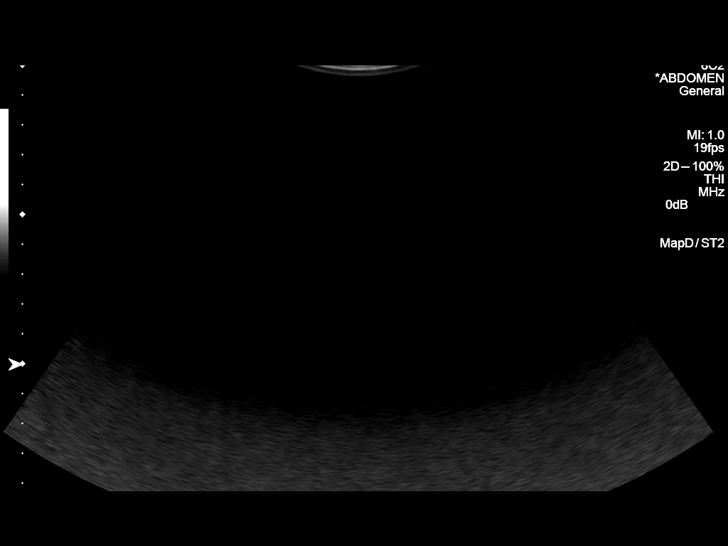
[im 9/105]
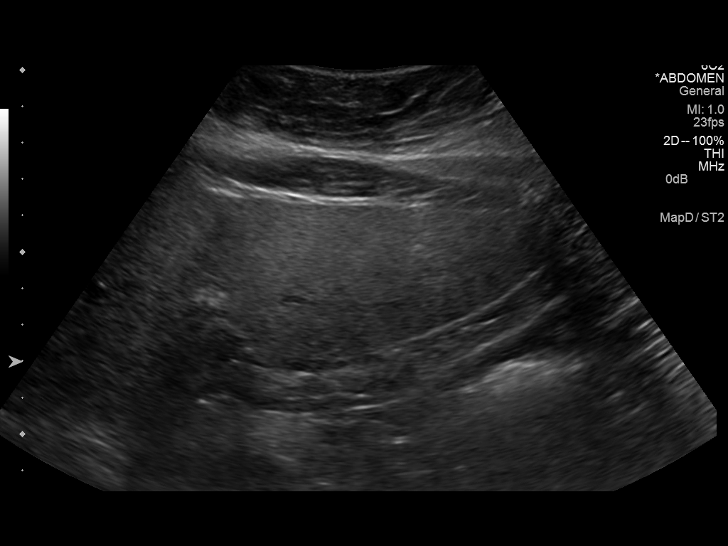
[im 18/105]
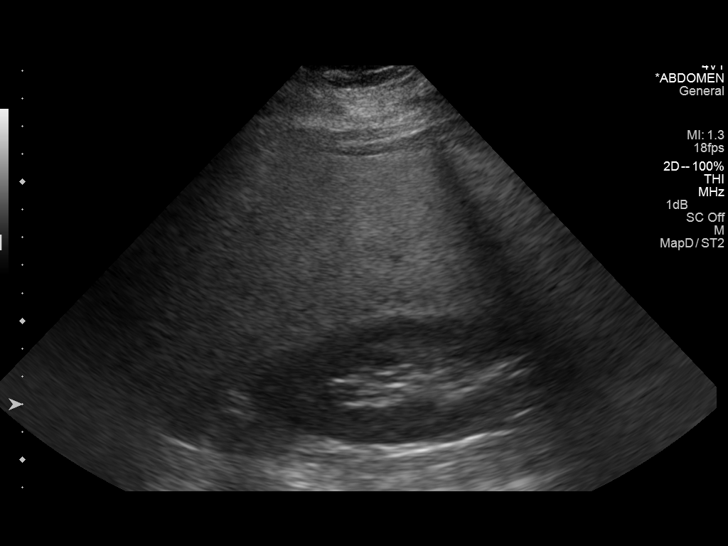
[im 27/105]
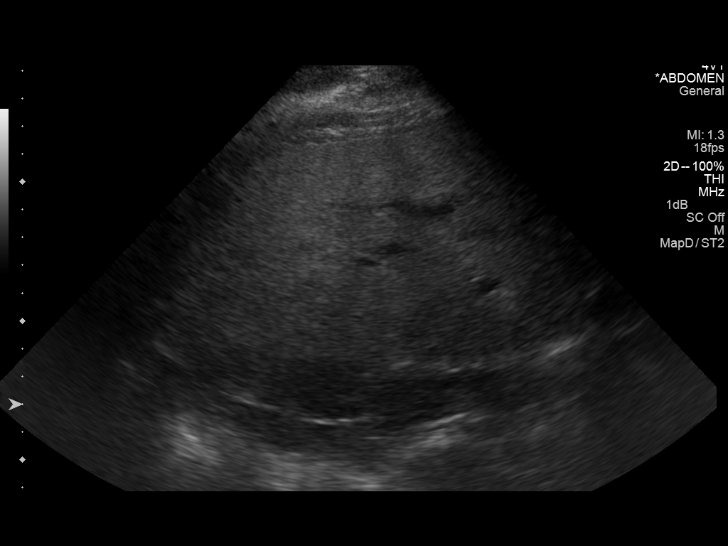
[im 35/105]
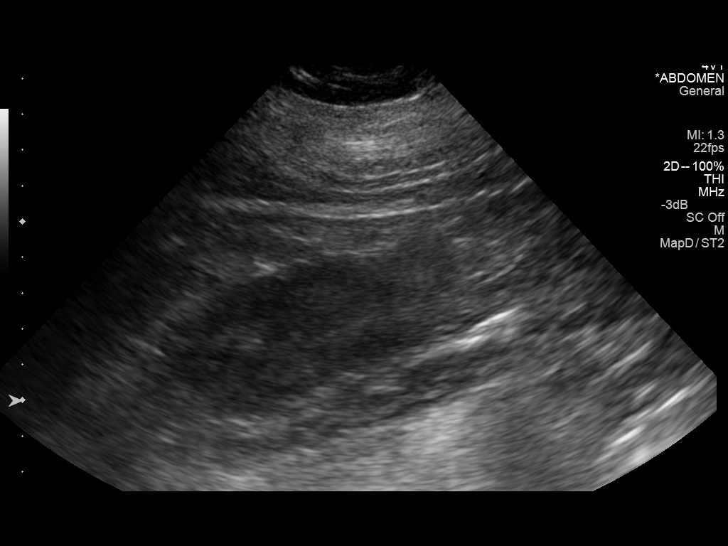
[im 40/105]
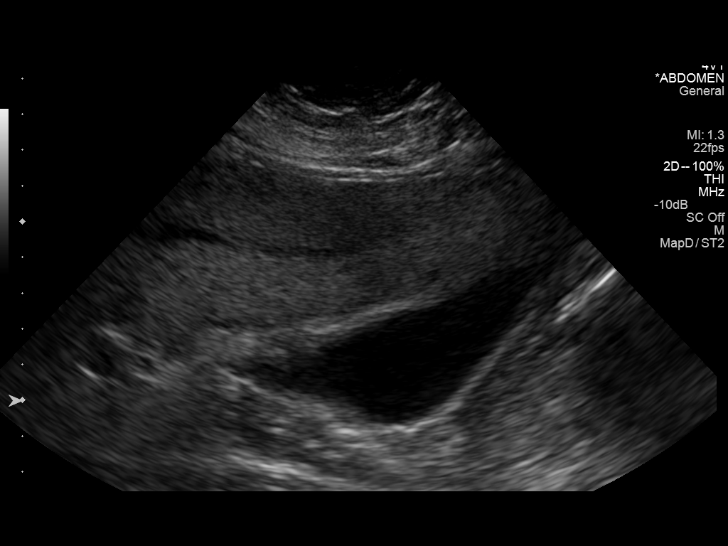
[im 48/105]
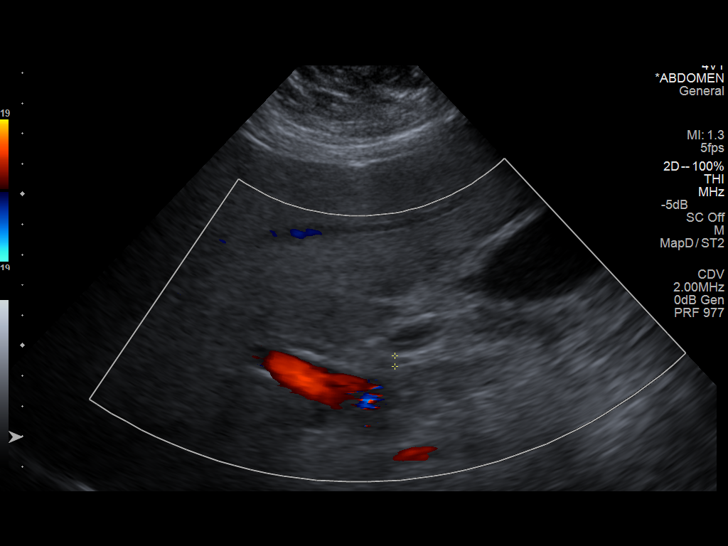
[im 57/105]
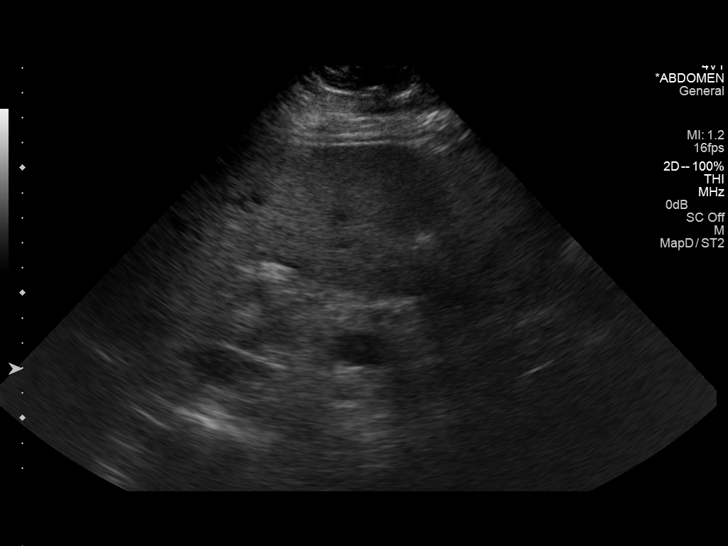
[im 66/105]
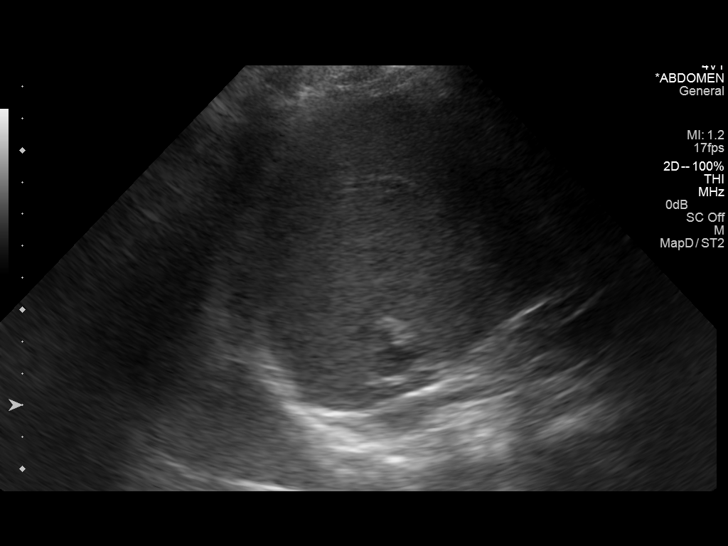
[im 70/105]
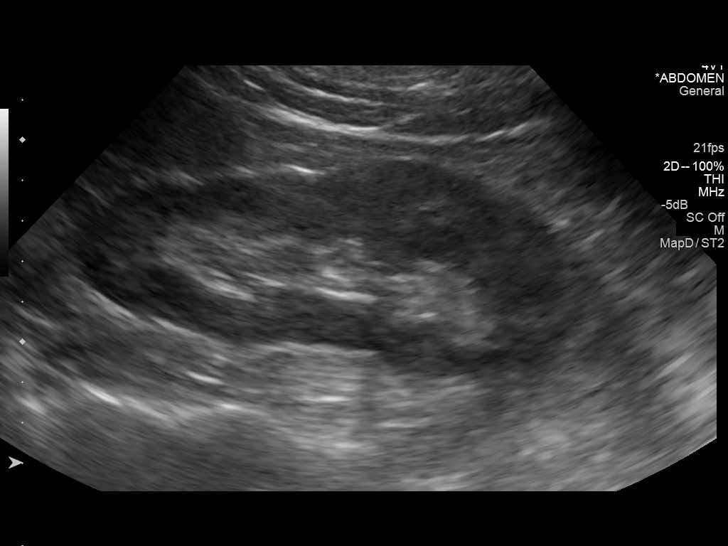
[im 79/105]
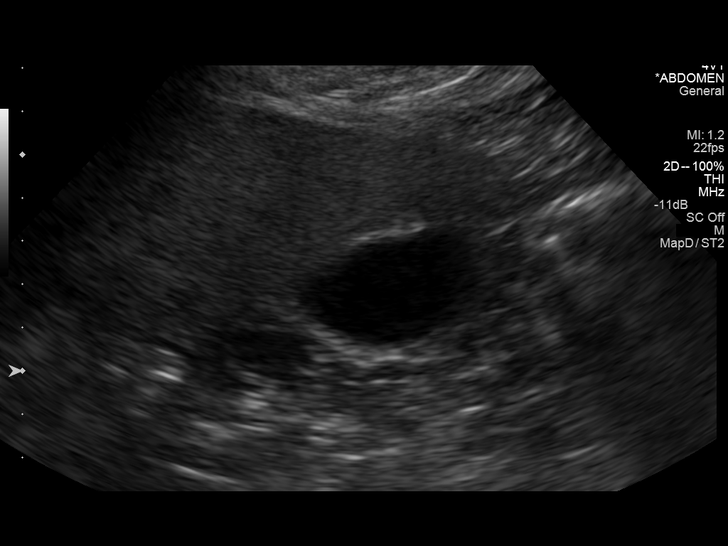
[im 87/105]
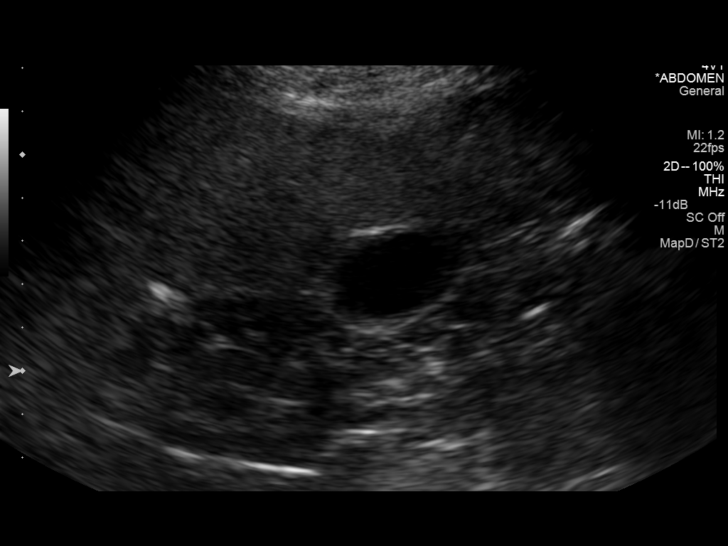
[im 96/105]
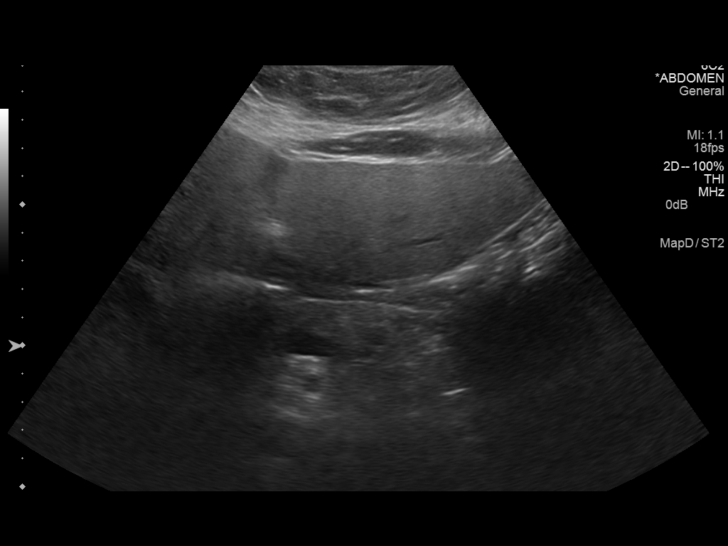
[im 105/105]
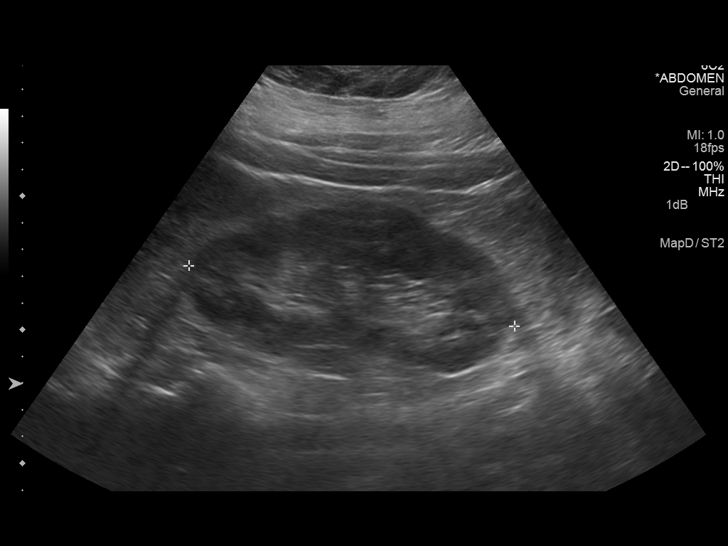

[14 of 25 positions shown; findings below may reference images not displayed]

FINDINGS: Gallbladder:

No gallstones or wall thickening visualized. No sonographic Murphy
sign noted.

Common bile duct:

Diameter: 4 mm

Liver:

No focal hepatic lesion. There is hepatomegaly. There is increased
hepatic echogenicity. There is no intrahepatic ductal dilatation.

IVC:

No abnormality visualized.

Pancreas:

Visualized portion unremarkable.

Spleen:

No focal splenic abnormality.  Mild splenomegaly.

Right Kidney:

Length: 14.2 cm. Echogenicity within normal limits. No mass or
hydronephrosis visualized.

Left Kidney:

Length: 12.4 cm. Echogenicity within normal limits. No mass or
hydronephrosis visualized.

Abdominal aorta:

No aneurysm visualized.

Other findings:

None.
IMPRESSION: Hepatomegaly with increased hepatic echogenicity as can be seen with
hepatocellular disease such as hepatic steatosis.

Mild splenomegaly without focal abnormality.
